# Patient Record
Sex: Female | Born: 1980
Health system: Southern US, Community
[De-identification: ages and names within clinical notes are randomized; demographics above are authoritative.]

## PROBLEM LIST (undated history)

## (undated) HISTORY — PX: BREAST CYST ASPIRATION: SHX578

---

## 2006-05-14 ENCOUNTER — Ambulatory Visit: Payer: Self-pay | Admitting: Obstetrics & Gynecology

## 2006-05-29 ENCOUNTER — Ambulatory Visit: Payer: Self-pay | Admitting: Obstetrics & Gynecology

## 2006-06-14 ENCOUNTER — Encounter: Payer: Self-pay | Admitting: Maternal & Fetal Medicine

## 2006-06-21 ENCOUNTER — Encounter: Payer: Self-pay | Admitting: Obstetrics and Gynecology

## 2006-06-28 ENCOUNTER — Encounter: Payer: Self-pay | Admitting: Obstetrics and Gynecology

## 2006-07-05 ENCOUNTER — Encounter: Payer: Self-pay | Admitting: Maternal & Fetal Medicine

## 2006-07-19 ENCOUNTER — Encounter: Payer: Self-pay | Admitting: Maternal & Fetal Medicine

## 2006-08-02 ENCOUNTER — Encounter: Payer: Self-pay | Admitting: Obstetrics and Gynecology

## 2006-08-02 ENCOUNTER — Observation Stay: Payer: Self-pay

## 2006-08-08 ENCOUNTER — Inpatient Hospital Stay: Payer: Self-pay | Admitting: Unknown Physician Specialty

## 2007-11-22 ENCOUNTER — Ambulatory Visit: Payer: Self-pay | Admitting: Family Medicine

## 2012-09-27 ENCOUNTER — Ambulatory Visit: Payer: Self-pay

## 2012-10-04 ENCOUNTER — Ambulatory Visit: Payer: Self-pay

## 2015-02-03 ENCOUNTER — Ambulatory Visit: Payer: Self-pay | Admitting: Podiatry

## 2015-02-10 ENCOUNTER — Ambulatory Visit (INDEPENDENT_AMBULATORY_CARE_PROVIDER_SITE_OTHER): Payer: Managed Care, Other (non HMO) | Admitting: Podiatry

## 2015-02-10 ENCOUNTER — Encounter: Payer: Self-pay | Admitting: Podiatry

## 2015-02-10 VITALS — BP 121/84 | HR 86 | Resp 16 | Ht 63.0 in | Wt 185.0 lb

## 2015-02-10 DIAGNOSIS — B078 Other viral warts: Secondary | ICD-10-CM

## 2015-02-10 DIAGNOSIS — B079 Viral wart, unspecified: Secondary | ICD-10-CM

## 2015-02-10 DIAGNOSIS — B070 Plantar wart: Secondary | ICD-10-CM

## 2015-02-10 MED ORDER — CIMETIDINE 800 MG PO TABS: 800.0000 mg | ORAL_TABLET | Freq: Every day | ORAL | Status: AC

## 2015-02-10 NOTE — Progress Notes (Signed)
   Subjective:    Patient ID: Michelle Garrett, female    DOB: 1980-10-09, 34 y.o.   MRN: [id number]  HPI  Patient presents with bilateral warts; ball of feet, heel & great toes. Pt stated, "warts are not painful"; x3 months.  Pt is diabetic type 2, Sugar=did not take today; A1C=5.6.  Review of Systems  All other systems reviewed and are negative.      Objective:   Physical Exam        Assessment & Plan:

## 2015-02-11 NOTE — Progress Notes (Signed)
Subjective:     Patient ID: Michelle Garrett, female   DOB: 1980/09/01, 34 y.o.   MRN: [id number]  HPI patient presents with numerous lesions on the bottom of both feet that are moderately painful but she is concerned about the extent of them and there is black spots within a number of the lesions themselves. States it's been present for several months   Review of Systems  All other systems reviewed and are negative.      Objective:   Physical Exam  Constitutional: She is oriented to person, place, and time.  Cardiovascular: Intact distal pulses.   Musculoskeletal: Normal range of motion.  Neurological: She is oriented to person, place, and time.  Skin: Skin is warm.  Nursing note and vitals reviewed.  neurovascular status found to be intact with muscle strength adequate range of motion within normal limits with patient noted to have multiple small lesions plantar aspect both feet that upon debridement show pinpoint bleeding and are painful to lateral pressure. There is a approximate 30 lesions toe with about 15 on each foot     Assessment:     Verruca plantaris plantar aspect bilateral    Plan:     Individual debridement of all lesions accomplished and applied chemical agent to create an immune response and sterile dressings. Instructed what to do if blistering were to occur

## 2015-03-10 ENCOUNTER — Encounter: Payer: Self-pay | Admitting: Podiatry

## 2015-03-10 ENCOUNTER — Ambulatory Visit (INDEPENDENT_AMBULATORY_CARE_PROVIDER_SITE_OTHER): Payer: Managed Care, Other (non HMO) | Admitting: Podiatry

## 2015-03-10 VITALS — BP 117/76 | HR 86 | Resp 16

## 2015-03-10 DIAGNOSIS — B070 Plantar wart: Secondary | ICD-10-CM

## 2015-03-10 DIAGNOSIS — B079 Viral wart, unspecified: Secondary | ICD-10-CM

## 2015-03-10 DIAGNOSIS — B078 Other viral warts: Secondary | ICD-10-CM

## 2015-03-15 NOTE — Progress Notes (Signed)
Subjective:     Patient ID: Michelle Garrett, female   DOB: 03/17/1980, 35 y.o.   MRN: [id number]  HPI patient states the warts are still present but some improvement from previous   Review of Systems     Objective:   Physical Exam Neurovascular status intact with keratotic lesion still noted that upon debridement show pinpoint bleeding bilateral    Assessment:     Verruca plantaris plantar aspect of both feet    Plan:     Debride lesions and at this time applied a small amount of chemical to the areas and sterile dressings. Reappoint to recheck

## 2015-07-20 ENCOUNTER — Other Ambulatory Visit: Payer: Self-pay | Admitting: Obstetrics and Gynecology

## 2015-07-20 DIAGNOSIS — Z1231 Encounter for screening mammogram for malignant neoplasm of breast: Secondary | ICD-10-CM

## 2015-08-09 ENCOUNTER — Ambulatory Visit
Admission: RE | Admit: 2015-08-09 | Discharge: 2015-08-09 | Disposition: A | Discharge status: Home / Self Care | Payer: 59 | Source: Ambulatory Visit | Attending: Obstetrics and Gynecology | Admitting: Obstetrics and Gynecology

## 2015-08-09 DIAGNOSIS — Z1231 Encounter for screening mammogram for malignant neoplasm of breast: Secondary | ICD-10-CM | POA: Insufficient documentation

## 2016-03-08 DIAGNOSIS — E119 Type 2 diabetes mellitus without complications: Secondary | ICD-10-CM | POA: Diagnosis not present

## 2016-03-08 DIAGNOSIS — D696 Thrombocytopenia, unspecified: Secondary | ICD-10-CM | POA: Diagnosis not present

## 2016-04-14 DIAGNOSIS — J9801 Acute bronchospasm: Secondary | ICD-10-CM | POA: Diagnosis not present

## 2016-07-25 DIAGNOSIS — Z01419 Encounter for gynecological examination (general) (routine) without abnormal findings: Secondary | ICD-10-CM | POA: Diagnosis not present

## 2016-08-16 DIAGNOSIS — R7302 Impaired glucose tolerance (oral): Secondary | ICD-10-CM | POA: Diagnosis not present

## 2016-08-16 DIAGNOSIS — I10 Essential (primary) hypertension: Secondary | ICD-10-CM | POA: Diagnosis not present

## 2016-08-21 DIAGNOSIS — E119 Type 2 diabetes mellitus without complications: Secondary | ICD-10-CM | POA: Diagnosis not present

## 2016-12-27 DIAGNOSIS — Z23 Encounter for immunization: Secondary | ICD-10-CM | POA: Diagnosis not present

## 2017-08-22 DIAGNOSIS — Z01419 Encounter for gynecological examination (general) (routine) without abnormal findings: Secondary | ICD-10-CM | POA: Diagnosis not present

## 2017-08-22 DIAGNOSIS — N6320 Unspecified lump in the left breast, unspecified quadrant: Secondary | ICD-10-CM | POA: Diagnosis not present

## 2017-08-22 DIAGNOSIS — N6310 Unspecified lump in the right breast, unspecified quadrant: Secondary | ICD-10-CM | POA: Diagnosis not present

## 2017-08-23 ENCOUNTER — Other Ambulatory Visit: Payer: Self-pay | Admitting: Obstetrics and Gynecology

## 2017-08-23 DIAGNOSIS — N6310 Unspecified lump in the right breast, unspecified quadrant: Secondary | ICD-10-CM

## 2017-08-23 DIAGNOSIS — N6320 Unspecified lump in the left breast, unspecified quadrant: Principal | ICD-10-CM

## 2017-08-24 ENCOUNTER — Ambulatory Visit
Admission: RE | Admit: 2017-08-24 | Discharge: 2017-08-24 | Disposition: A | Discharge status: Home / Self Care | Payer: 59 | Source: Ambulatory Visit | Attending: Obstetrics and Gynecology | Admitting: Obstetrics and Gynecology

## 2017-08-24 DIAGNOSIS — N6310 Unspecified lump in the right breast, unspecified quadrant: Secondary | ICD-10-CM | POA: Insufficient documentation

## 2017-08-24 DIAGNOSIS — N6012 Diffuse cystic mastopathy of left breast: Secondary | ICD-10-CM | POA: Diagnosis not present

## 2017-08-24 DIAGNOSIS — N6320 Unspecified lump in the left breast, unspecified quadrant: Principal | ICD-10-CM

## 2017-08-24 DIAGNOSIS — N6001 Solitary cyst of right breast: Secondary | ICD-10-CM | POA: Diagnosis not present

## 2017-08-24 DIAGNOSIS — R922 Inconclusive mammogram: Secondary | ICD-10-CM | POA: Diagnosis not present

## 2017-08-29 ENCOUNTER — Other Ambulatory Visit: Payer: 59

## 2017-12-24 DIAGNOSIS — L819 Disorder of pigmentation, unspecified: Secondary | ICD-10-CM | POA: Diagnosis not present

## 2017-12-24 DIAGNOSIS — L02219 Cutaneous abscess of trunk, unspecified: Secondary | ICD-10-CM | POA: Diagnosis not present

## 2018-04-18 DIAGNOSIS — Z23 Encounter for immunization: Secondary | ICD-10-CM | POA: Diagnosis not present

## 2018-04-18 DIAGNOSIS — Z0000 Encounter for general adult medical examination without abnormal findings: Secondary | ICD-10-CM | POA: Diagnosis not present

## 2018-04-18 DIAGNOSIS — R030 Elevated blood-pressure reading, without diagnosis of hypertension: Secondary | ICD-10-CM | POA: Diagnosis not present

## 2018-04-18 DIAGNOSIS — Z131 Encounter for screening for diabetes mellitus: Secondary | ICD-10-CM | POA: Diagnosis not present

## 2018-04-18 DIAGNOSIS — Z130 Encounter for screening for diseases of the blood and blood-forming organs and certain disorders involving the immune mechanism: Secondary | ICD-10-CM | POA: Diagnosis not present

## 2020-02-26 ENCOUNTER — Other Ambulatory Visit: Payer: Self-pay | Admitting: Student

## 2020-02-26 DIAGNOSIS — R748 Abnormal levels of other serum enzymes: Secondary | ICD-10-CM

## 2020-03-24 ENCOUNTER — Other Ambulatory Visit: Payer: Self-pay

## 2020-03-24 ENCOUNTER — Ambulatory Visit
Admission: RE | Admit: 2020-03-24 | Discharge: 2020-03-24 | Disposition: A | Discharge status: Home / Self Care | Payer: 59 | Source: Ambulatory Visit | Attending: Student | Admitting: Student

## 2020-03-24 DIAGNOSIS — R748 Abnormal levels of other serum enzymes: Secondary | ICD-10-CM | POA: Diagnosis not present

## 2020-05-04 ENCOUNTER — Other Ambulatory Visit: Payer: Self-pay | Admitting: Student

## 2020-05-04 DIAGNOSIS — Z803 Family history of malignant neoplasm of breast: Secondary | ICD-10-CM

## 2020-05-04 DIAGNOSIS — N6452 Nipple discharge: Secondary | ICD-10-CM

## 2020-05-05 ENCOUNTER — Ambulatory Visit
Admission: RE | Admit: 2020-05-05 | Discharge: 2020-05-05 | Disposition: A | Discharge status: Home / Self Care | Payer: 59 | Source: Ambulatory Visit | Attending: Student | Admitting: Student

## 2020-05-05 ENCOUNTER — Other Ambulatory Visit: Payer: Self-pay

## 2020-05-05 DIAGNOSIS — Z803 Family history of malignant neoplasm of breast: Secondary | ICD-10-CM | POA: Insufficient documentation

## 2020-05-05 DIAGNOSIS — N6452 Nipple discharge: Secondary | ICD-10-CM | POA: Diagnosis not present

## 2020-05-11 ENCOUNTER — Other Ambulatory Visit: Payer: Self-pay | Admitting: Student

## 2020-05-11 DIAGNOSIS — N6001 Solitary cyst of right breast: Secondary | ICD-10-CM

## 2020-05-11 DIAGNOSIS — N6452 Nipple discharge: Secondary | ICD-10-CM

## 2020-05-11 DIAGNOSIS — R928 Other abnormal and inconclusive findings on diagnostic imaging of breast: Secondary | ICD-10-CM

## 2020-05-14 ENCOUNTER — Ambulatory Visit
Admission: RE | Admit: 2020-05-14 | Discharge: 2020-05-14 | Disposition: A | Discharge status: Home / Self Care | Payer: 59 | Source: Ambulatory Visit | Attending: Student | Admitting: Student

## 2020-05-14 ENCOUNTER — Other Ambulatory Visit: Payer: Self-pay

## 2020-05-14 DIAGNOSIS — R928 Other abnormal and inconclusive findings on diagnostic imaging of breast: Secondary | ICD-10-CM | POA: Diagnosis present

## 2020-05-14 DIAGNOSIS — N6001 Solitary cyst of right breast: Secondary | ICD-10-CM | POA: Diagnosis present

## 2020-05-25 ENCOUNTER — Ambulatory Visit
Admission: RE | Admit: 2020-05-25 | Discharge: 2020-05-25 | Disposition: A | Discharge status: Home / Self Care | Payer: 59 | Source: Ambulatory Visit | Attending: Student | Admitting: Student

## 2020-05-25 ENCOUNTER — Other Ambulatory Visit: Payer: Self-pay

## 2020-05-25 DIAGNOSIS — N6452 Nipple discharge: Secondary | ICD-10-CM | POA: Insufficient documentation

## 2020-05-25 MED ORDER — GADOBUTROL 1 MMOL/ML IV SOLN
8.0000 mL | Freq: Once | INTRAVENOUS | Status: AC | PRN
  Administered 2020-05-25: 8 mL via INTRAVENOUS

## 2020-06-14 ENCOUNTER — Other Ambulatory Visit: Payer: Self-pay

## 2020-06-14 ENCOUNTER — Ambulatory Visit (INDEPENDENT_AMBULATORY_CARE_PROVIDER_SITE_OTHER): Payer: 59 | Admitting: Dermatology

## 2020-06-14 DIAGNOSIS — L308 Other specified dermatitis: Secondary | ICD-10-CM

## 2020-06-14 DIAGNOSIS — L309 Dermatitis, unspecified: Secondary | ICD-10-CM

## 2020-06-14 DIAGNOSIS — L578 Other skin changes due to chronic exposure to nonionizing radiation: Secondary | ICD-10-CM | POA: Diagnosis not present

## 2020-06-14 DIAGNOSIS — D229 Melanocytic nevi, unspecified: Secondary | ICD-10-CM

## 2020-06-14 MED ORDER — KETOCONAZOLE 2 % EX CREA: 1.0000 "application " | TOPICAL_CREAM | Freq: Two times a day (BID) | CUTANEOUS | 0 refills | Status: AC

## 2020-06-14 MED ORDER — TRIAMCINOLONE ACETONIDE 0.1 % EX OINT: 1.0000 "application " | TOPICAL_OINTMENT | Freq: Two times a day (BID) | CUTANEOUS | 2 refills | Status: AC | PRN

## 2020-06-14 MED ORDER — MUPIROCIN 2 % EX OINT: 1.0000 "application " | TOPICAL_OINTMENT | Freq: Two times a day (BID) | CUTANEOUS | 0 refills | Status: AC

## 2020-06-14 NOTE — Progress Notes (Signed)
   New Patient Visit  Subjective  Michelle Garrett is a 40 y.o. female who presents for the following: Skin Problem (New patient here today for swelling and itching at the left nipple, present for about 4 months. She previously had drainage that was yellowish green and sticky but that has resolved. Patient has had MRI and ultrasound but neither showed any sign of [place]. ).  The following portions of the chart were reviewed this encounter and updated as appropriate:   Allergies  Meds  Problems  Med Hx  Surg Hx  Fam Hx      Review of Systems:  No other skin or systemic complaints except as noted in HPI or Assessment and Plan.  Objective  Well appearing patient in no apparent distress; mood and affect are within normal limits.  A focused examination was performed including chest. Relevant physical exam findings are noted in the Assessment and Plan.  Objective  Left nipple: Scaly pink plaque at nipple and extending 76mm peripherally    Assessment & Plan  Nipple dermatitis Left nipple  Start ketoconazole 2% cream BID to AA. Start mupirocin ointment BID to AA. Start TMC 0.1% ointment BID to AA for up to 2 weeks. Avoid applying to face, groin, and axilla. Use as directed. Risk of skin atrophy with long-term use reviewed.   Topical steroids (such as triamcinolone, fluocinolone, fluocinonide, mometasone, clobetasol, halobetasol, betamethasone, hydrocortisone) can cause thinning and lightening of the skin if they are used for too long in the same area. Your physician has selected the right strength medicine for your problem and area affected on the body. Please use your medication only as directed by your physician to prevent side effects.    Ordered Medications: ketoconazole (NIZORAL) 2 % cream triamcinolone ointment (KENALOG) 0.1 % mupirocin ointment (BACTROBAN) 2 %  Melanocytic Nevi - Tan-brown and/or pink-flesh-colored symmetric macules and papules - Benign appearing on exam  today - Observation - Call clinic for new or changing moles - Recommend daily use of broad spectrum spf 30+ sunscreen to sun-exposed areas.   Actinic Damage - chronic, secondary to cumulative UV radiation exposure/sun exposure over time - diffuse scaly erythematous macules with underlying dyspigmentation - Recommend daily broad spectrum sunscreen SPF 30+ to sun-exposed areas, reapply every 2 hours as needed.  - Recommend staying in the shade or wearing long sleeves, sun glasses (UVA+UVB protection) and wide brim hats (4-inch brim around the entire circumference of the hat). - Call for new or changing lesions.  Return in about 2 weeks (around 06/28/2020).  Graciella Belton, RMA, am acting as scribe for Forest Gleason, MD .  Documentation: I have reviewed the above documentation for accuracy and completeness, and I agree with the above.  Forest Gleason, MD

## 2020-06-14 NOTE — Patient Instructions (Addendum)
Start ketoconazole 2% cream twice daily. Start mupirocin ointment twice daily. Start triamcinolone 0.1% ointment twice daily for up to 2 weeks. Avoid applying to face, groin, and axilla. Use as directed. Risk of skin atrophy with long-term use reviewed.   Recommend daily broad spectrum sunscreen SPF 30+ to sun-exposed areas, reapply every 2 hours as needed. Call for new or changing lesions.  Staying in the shade or wearing long sleeves, sun glasses (UVA+UVB protection) and wide brim hats (4-inch brim around the entire circumference of the hat) are also recommended for sun protection.   Recommend taking Heliocare sun protection supplement daily in sunny weather for additional sun protection. For maximum protection on the sunniest days, you can take up to 2 capsules of regular Heliocare OR take 1 capsule of Heliocare Ultra. For prolonged exposure (such as a full day in the sun), you can repeat your dose of the supplement 4 hours after your first dose. Heliocare can be purchased at [facility] or at [email or web address].   If you have any questions or concerns for your doctor, please call our main line at [phone] and press option 4 to reach your doctor's medical assistant. If no one answers, please leave a voicemail as directed and we will return your call as soon as possible. Messages left after 4 pm will be answered the following business day.   You may also send [place] a message via [place]. We typically respond to MyChart messages within 1-2 business days.  For prescription refills, please ask your pharmacy to contact our office. Our fax number is [phone].  If you have an urgent issue when the clinic is closed that cannot wait until the next business day, you can page your doctor at the number below.    Please note that while we do our best to be available for urgent issues outside of office hours, we are not available 24/7.   If you have an urgent issue and are unable to reach [place],  you may choose to seek medical care at your doctor's office, retail clinic, urgent care center, or emergency room.  If you have a medical emergency, please immediately call 911 or go to the emergency department.  Pager Numbers  - Dr. Nehemiah Massed: [phone]  - Dr. Laurence Ferrari: [phone]  - Dr. Nicole Kindred: [phone]  In the event of inclement weather, please call our main line at [phone] for an update on the status of any delays or closures.  Dermatology Medication Tips: Please keep the boxes that topical medications come in in order to help keep track of the instructions about where and how to use these. Pharmacies typically print the medication instructions only on the boxes and not directly on the medication tubes.   If your medication is too expensive, please contact our office at [phone] option 4 or send [place] a message through [place].   We are unable to tell what your co-pay for medications will be in advance as this is different depending on your insurance coverage. However, we may be able to find a substitute medication at lower cost or fill out paperwork to get insurance to cover a needed medication.   If a prior authorization is required to get your medication covered by your insurance company, please allow [place] 1-2 business days to complete this process.  Drug prices often vary depending on where the prescription is filled and some pharmacies may offer cheaper prices.  The website www.goodrx.com contains coupons for medications through different pharmacies. The prices here do  not account for what the cost may be with help from insurance (it may be cheaper with your insurance), but the website can give you the price if you did not use any insurance.  - You can print the associated coupon and take it with your prescription to the pharmacy.  - You may also stop by our office during regular business hours and pick up a GoodRx coupon card.  - If you need your prescription sent  electronically to a different pharmacy, notify our office through [facility] MyChart or by phone at [phone] option 4.  

## 2020-06-21 ENCOUNTER — Encounter: Payer: Self-pay | Admitting: Dermatology

## 2020-06-30 ENCOUNTER — Ambulatory Visit: Payer: 59 | Admitting: Dermatology

## 2020-10-27 ENCOUNTER — Other Ambulatory Visit: Payer: Self-pay

## 2020-10-27 DIAGNOSIS — Z1231 Encounter for screening mammogram for malignant neoplasm of breast: Secondary | ICD-10-CM

## 2021-05-06 ENCOUNTER — Ambulatory Visit
Admission: RE | Admit: 2021-05-06 | Discharge: 2021-05-06 | Disposition: A | Discharge status: Home / Self Care | Payer: 59 | Source: Ambulatory Visit

## 2021-05-06 ENCOUNTER — Other Ambulatory Visit: Payer: Self-pay

## 2021-05-06 DIAGNOSIS — Z1231 Encounter for screening mammogram for malignant neoplasm of breast: Secondary | ICD-10-CM | POA: Insufficient documentation

## 2021-05-13 ENCOUNTER — Other Ambulatory Visit: Payer: Self-pay

## 2021-05-13 DIAGNOSIS — R928 Other abnormal and inconclusive findings on diagnostic imaging of breast: Secondary | ICD-10-CM

## 2021-05-13 DIAGNOSIS — N6489 Other specified disorders of breast: Secondary | ICD-10-CM

## 2021-05-31 ENCOUNTER — Ambulatory Visit
Admission: RE | Admit: 2021-05-31 | Discharge: 2021-05-31 | Disposition: A | Discharge status: Home / Self Care | Payer: 59 | Source: Ambulatory Visit

## 2021-05-31 ENCOUNTER — Other Ambulatory Visit: Payer: Self-pay

## 2021-05-31 DIAGNOSIS — N6489 Other specified disorders of breast: Secondary | ICD-10-CM | POA: Diagnosis present

## 2021-05-31 DIAGNOSIS — R928 Other abnormal and inconclusive findings on diagnostic imaging of breast: Secondary | ICD-10-CM | POA: Diagnosis not present

## 2021-11-10 ENCOUNTER — Other Ambulatory Visit: Payer: Self-pay

## 2021-11-10 DIAGNOSIS — Z803 Family history of malignant neoplasm of breast: Secondary | ICD-10-CM

## 2022-08-23 ENCOUNTER — Other Ambulatory Visit: Payer: Self-pay

## 2022-08-23 DIAGNOSIS — Z803 Family history of malignant neoplasm of breast: Secondary | ICD-10-CM

## 2022-08-28 ENCOUNTER — Other Ambulatory Visit: Payer: Self-pay

## 2022-08-28 DIAGNOSIS — Z1231 Encounter for screening mammogram for malignant neoplasm of breast: Secondary | ICD-10-CM

## 2022-08-29 ENCOUNTER — Ambulatory Visit
Admission: RE | Admit: 2022-08-29 | Discharge: 2022-08-29 | Disposition: A | Discharge status: Home / Self Care | Payer: 59 | Source: Ambulatory Visit

## 2022-08-29 DIAGNOSIS — Z1239 Encounter for other screening for malignant neoplasm of breast: Secondary | ICD-10-CM | POA: Insufficient documentation

## 2022-08-29 DIAGNOSIS — Z803 Family history of malignant neoplasm of breast: Secondary | ICD-10-CM | POA: Diagnosis present

## 2022-08-29 MED ORDER — GADOBUTROL 1 MMOL/ML IV SOLN
7.5000 mL | Freq: Once | INTRAVENOUS | Status: AC | PRN
  Administered 2022-08-29: 7.5 mL via INTRAVENOUS

## 2022-09-13 IMAGING — US US BREAST*L* LIMITED INC AXILLA
1 series · 5 of 5 positions shown · non-contrast
Comparison: Previous exam(s).

CLINICAL DATA: Callback for LEFT breast asymmetry seen on CC view.
Previous LEFT breast MRI in 2122 with benign results. LEFT nipple
irritation and discharge in 2122; has been seen by dermatology in
symptoms have resolved. Patient has a strong family history of
breast cancer

EXAM:
DIGITAL DIAGNOSTIC UNILATERAL LEFT MAMMOGRAM WITH TOMOSYNTHESIS AND
CAD; ULTRASOUND LEFT BREAST LIMITED
TECHNIQUE: Left digital diagnostic mammography and breast tomosynthesis was
performed. The images were evaluated with computer-aided detection.;
Targeted ultrasound examination of the left breast was performed.

[Series 1: us breast*left* limited inc axilla · 0.06mm/px · 5 of 5 slices shown]
[im 1/5]
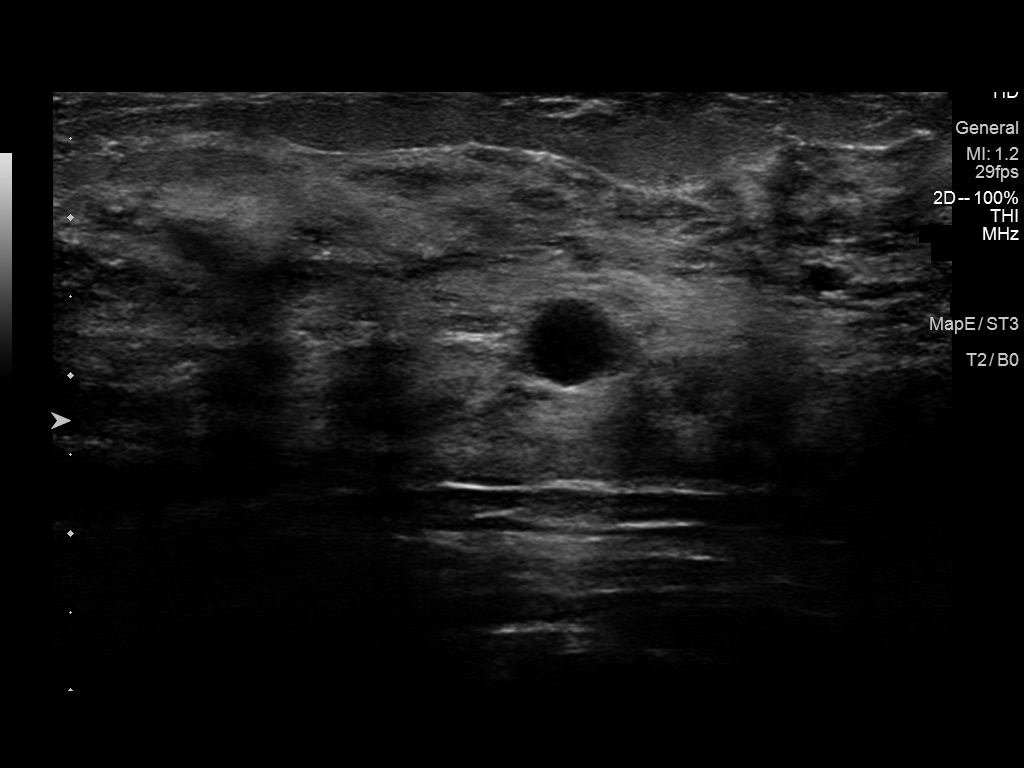
[im 2/5]
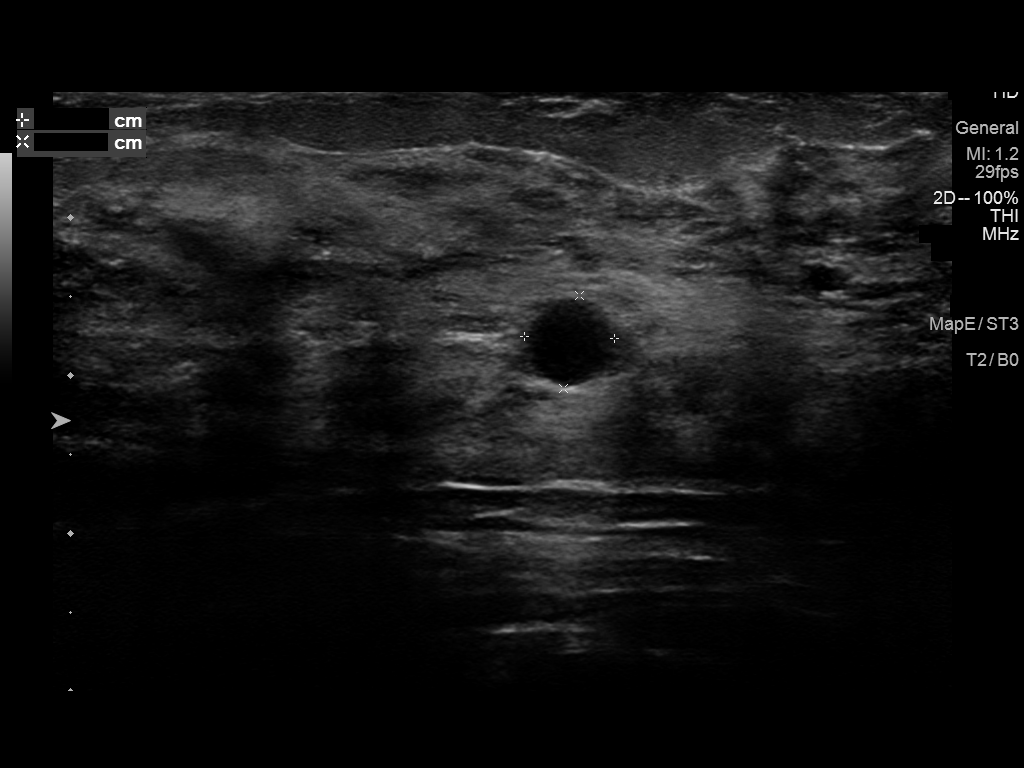
[im 3/5]
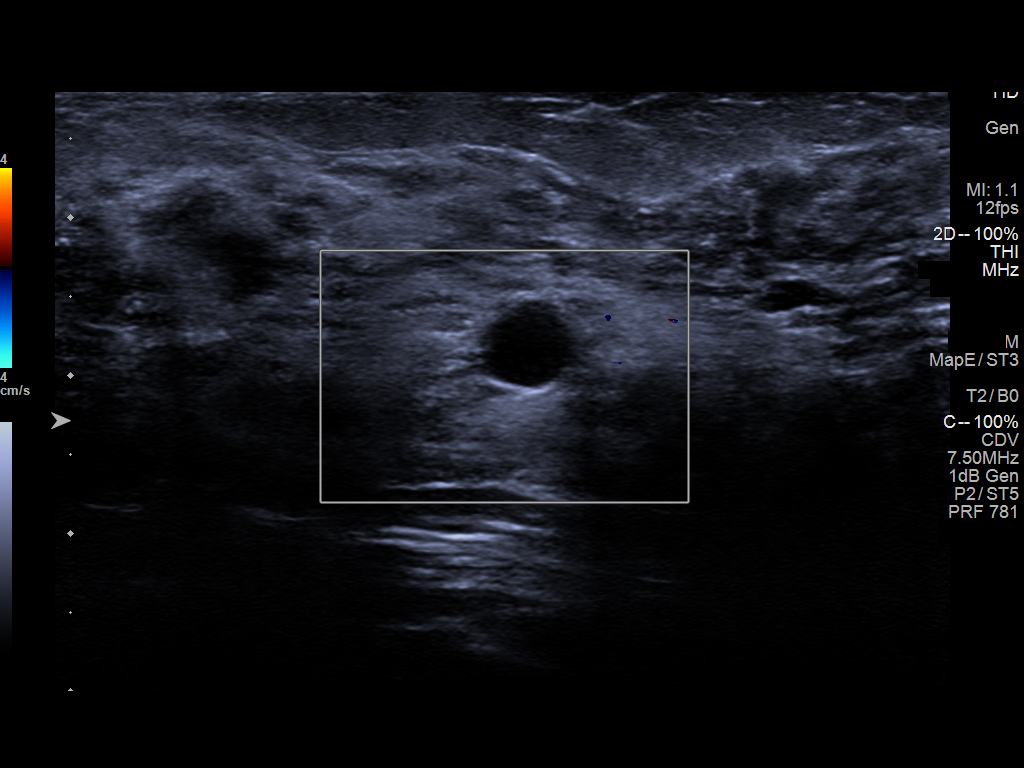
[im 4/5]
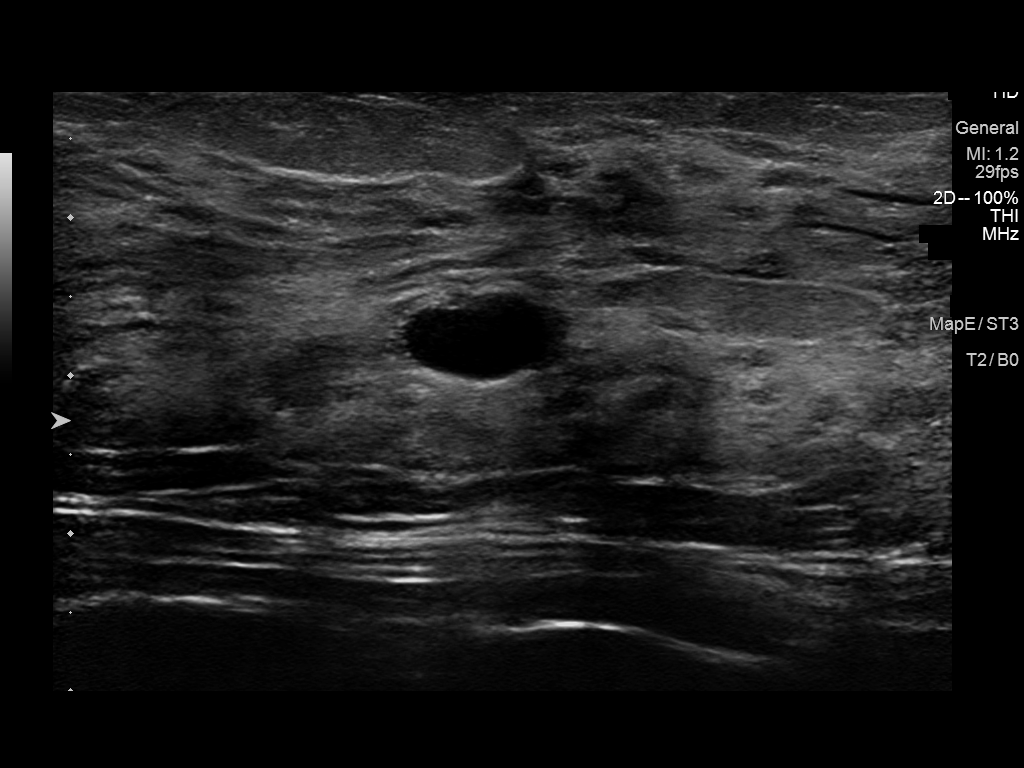
[im 5/5]
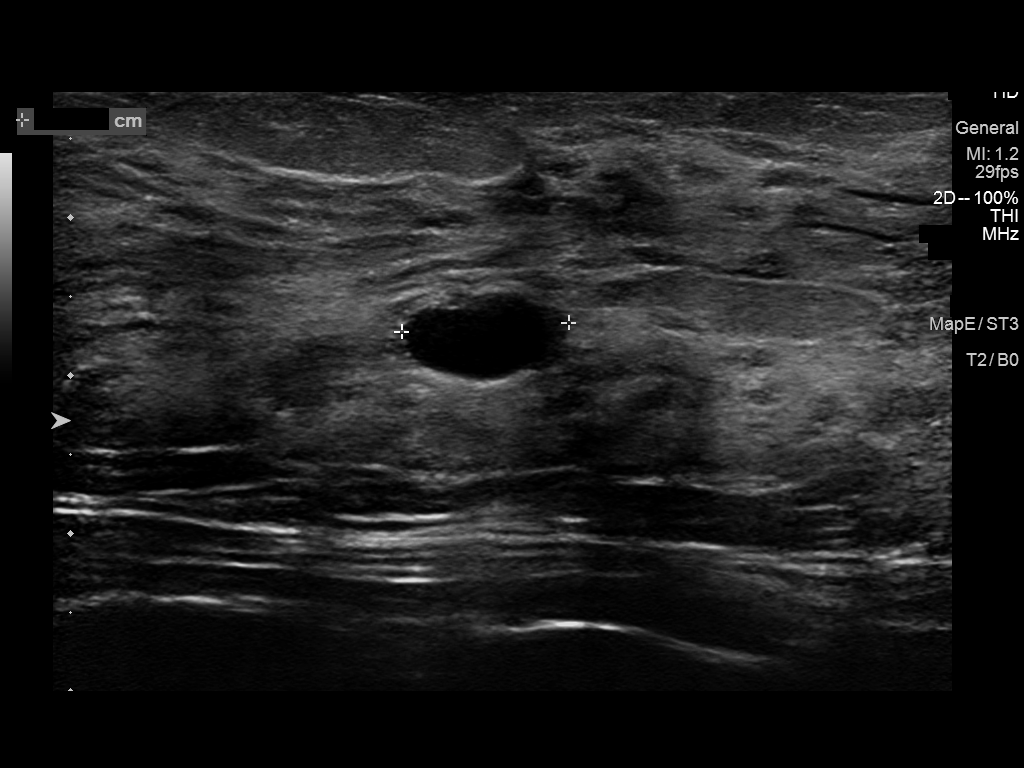

[5 of 5 positions shown; findings below may reference images not displayed]

ACR Breast Density Category d: The breast tissue is extremely dense,
which lowers the sensitivity of mammography.
FINDINGS: The previously described finding does not persist with additional
views, consistent with superimposed fibroglandular tissue. No
suspicious mass, microcalcification, or other finding is identified.

On physical exam, no suspicious mass is appreciated.

Targeted ultrasound was performed of the LEFT central breast. No
suspicious cystic or solid mass is seen at the site of screening
mammographic concern. Multiple benign cysts are noted central
breast. At 12 o'clock less than 1 cm from the nipple, there is an
oval circumscribed anechoic mass with posterior acoustic
enhancement. It measures 11 x 6 x 6 mm and is consistent with a
benign simple cyst.
IMPRESSION: No mammographic or sonographic evidence of malignancy. Multiple
benign cysts are noted.

RECOMMENDATION:
1. Recommend evaluation for candidacy for annual high risk screening
protocol with annual screening mammogram and annual screening MRI.
Given extreme breast density and family history of breast cancer,
supplemental screening in addition to annual screening mammography
should be strongly considered. The American Cancer Society
recommends annual MRI and mammography in patients with an estimated
lifetime risk of developing breast cancer greater than 20 - 25%, or
who are known or suspected to be positive for the breast cancer
gene.

I have discussed the findings and recommendations with the patient.
If applicable, a reminder letter will be sent to the patient
regarding the next appointment.

BI-RADS CATEGORY  2: Benign.

## 2022-09-13 IMAGING — MG MM DIGITAL DIAGNOSTIC UNILAT*L* W/ TOMO W/ CAD
6 series · 6 of 18 positions shown · non-contrast
Comparison: Previous exam(s).

CLINICAL DATA: Callback for LEFT breast asymmetry seen on CC view.
Previous LEFT breast MRI in 2122 with benign results. LEFT nipple
irritation and discharge in 2122; has been seen by dermatology in
symptoms have resolved. Patient has a strong family history of
breast cancer

EXAM:
DIGITAL DIAGNOSTIC UNILATERAL LEFT MAMMOGRAM WITH TOMOSYNTHESIS AND
CAD; ULTRASOUND LEFT BREAST LIMITED
TECHNIQUE: Left digital diagnostic mammography and breast tomosynthesis was
performed. The images were evaluated with computer-aided detection.;
Targeted ultrasound examination of the left breast was performed.

[L CC synth-2D]
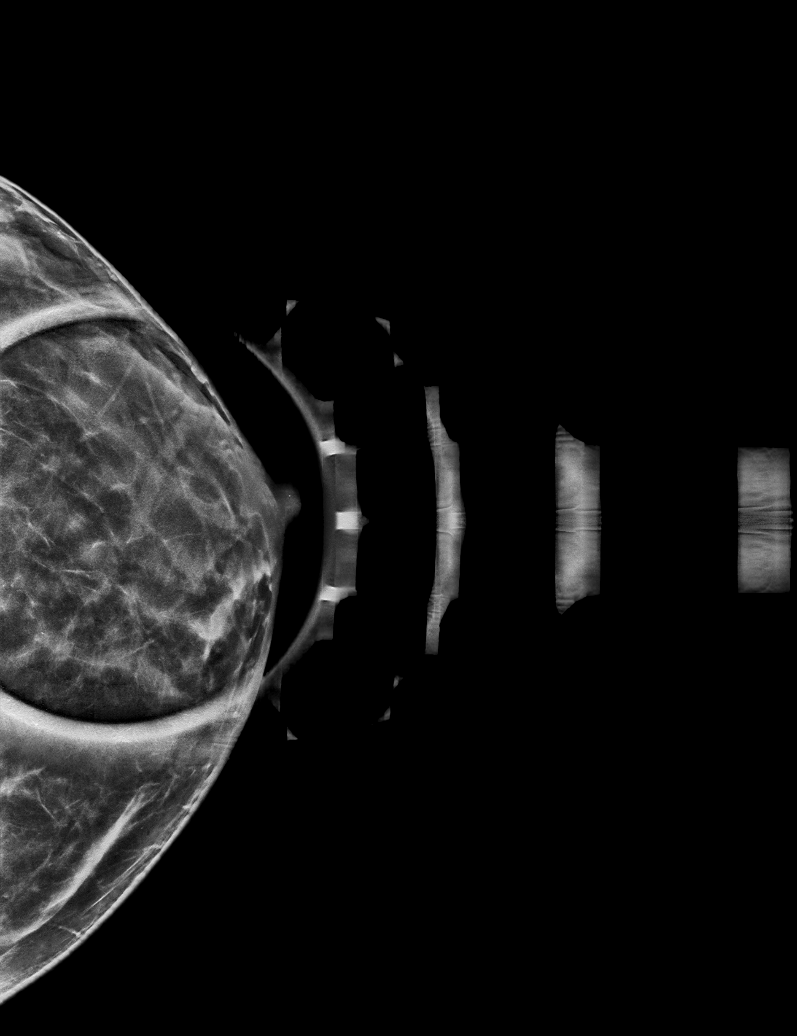

[L ML synth-2D]
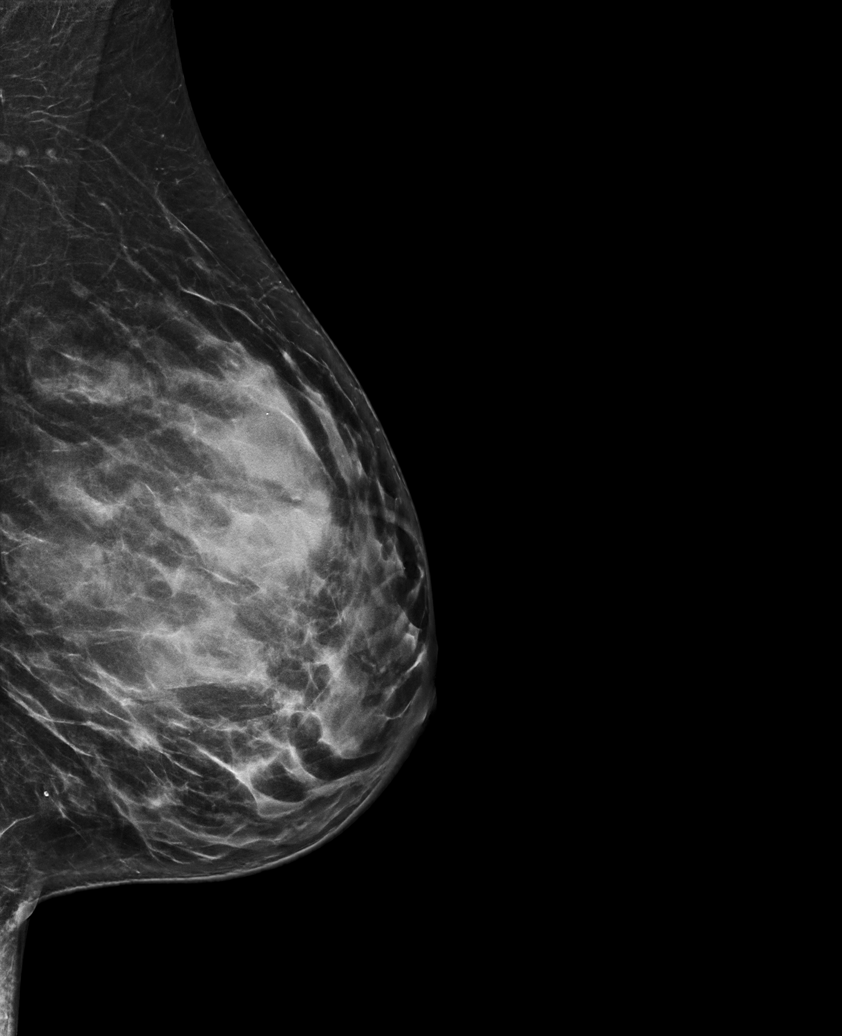

[L MLO synth-2D]
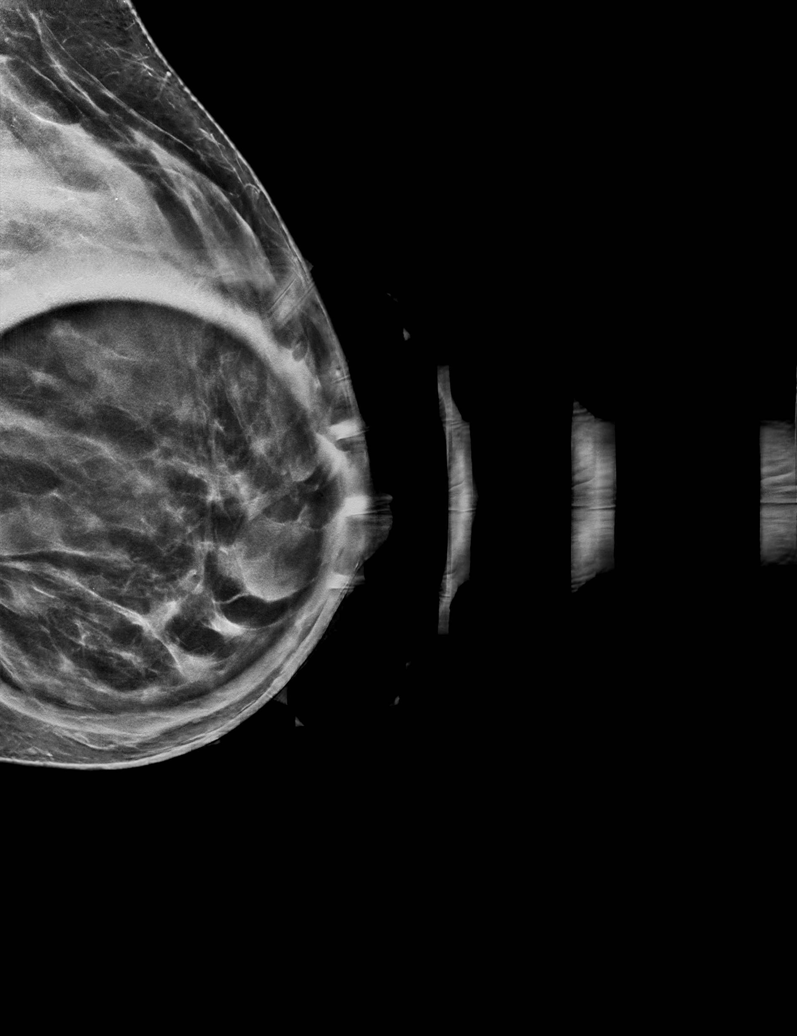

[L MLO tomo · tomo slice 37/73.0]
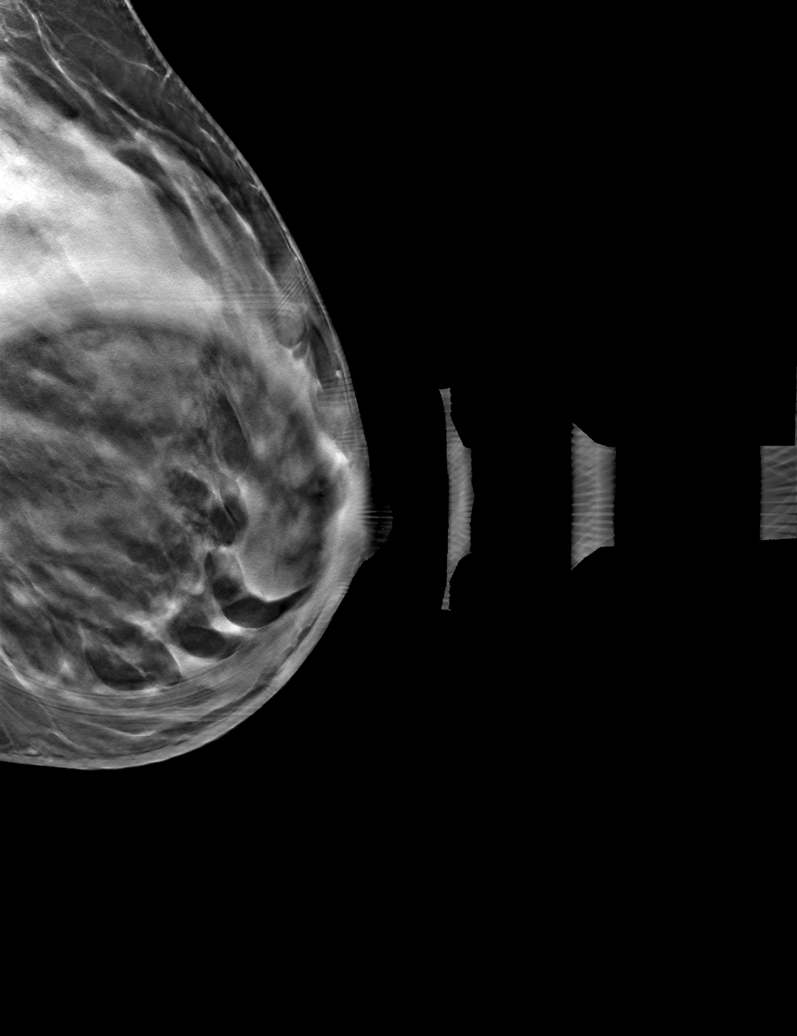

[L CC tomo · tomo slice 29/58.0]
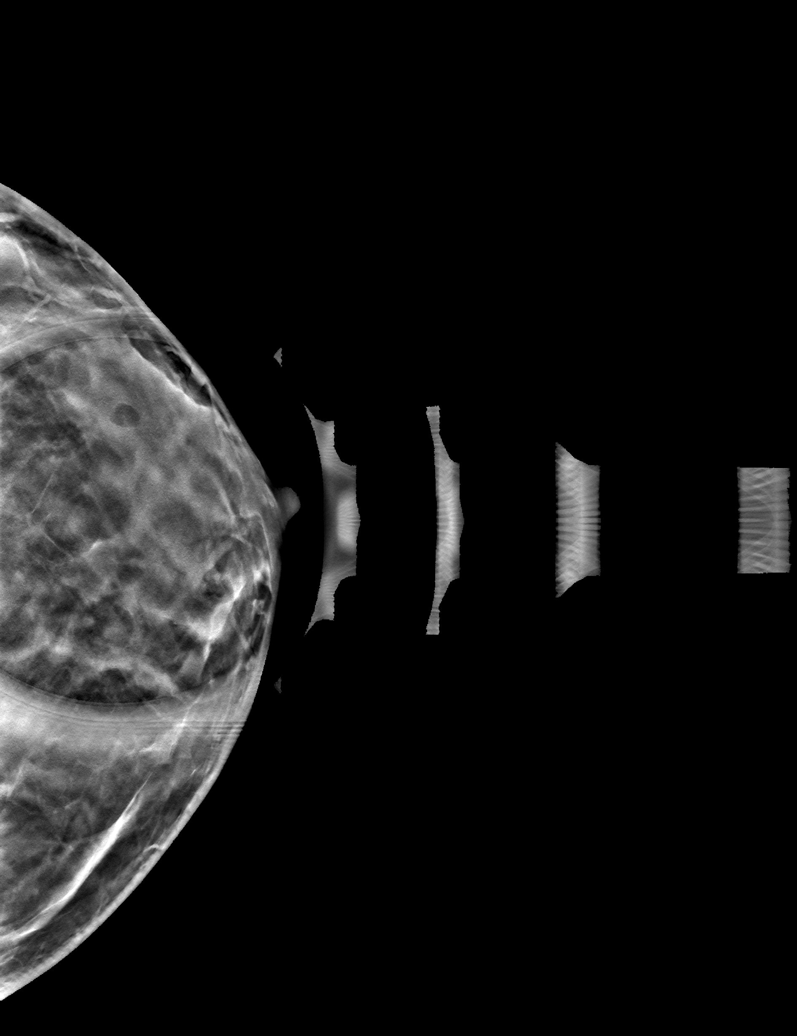

[L ML tomo · tomo slice 35/69.0]
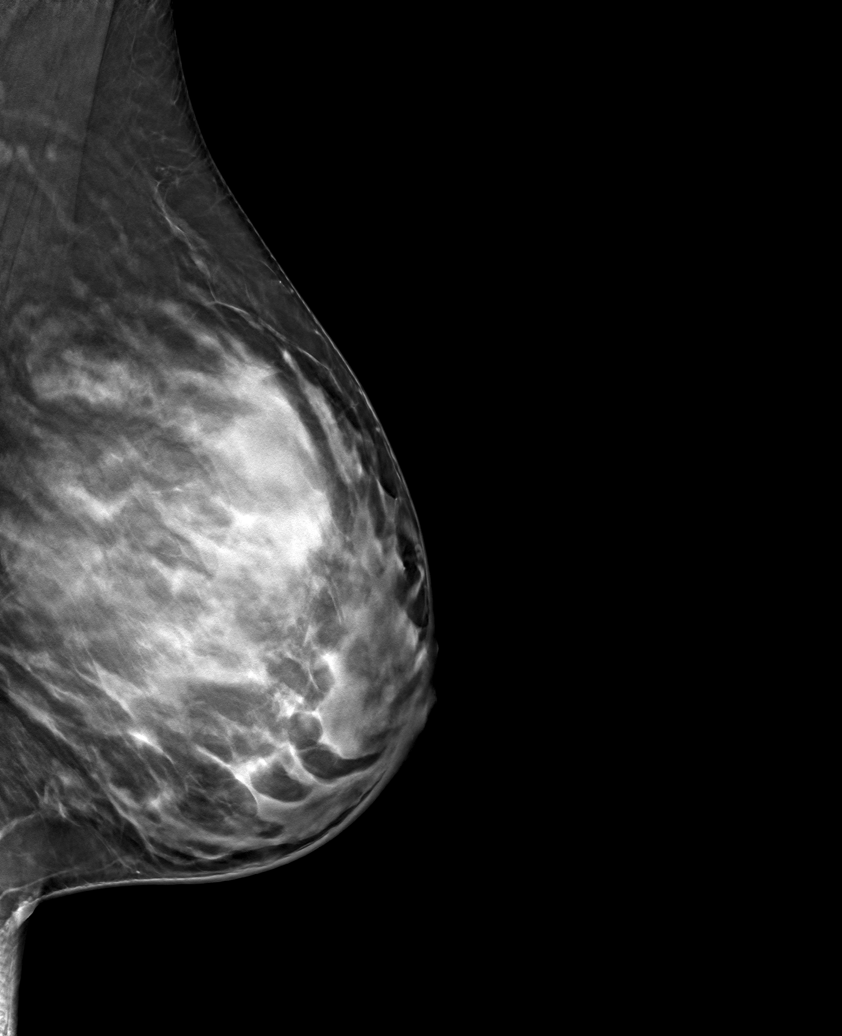

[6 of 18 positions shown; findings below may reference images not displayed]

ACR Breast Density Category d: The breast tissue is extremely dense,
which lowers the sensitivity of mammography.
FINDINGS: The previously described finding does not persist with additional
views, consistent with superimposed fibroglandular tissue. No
suspicious mass, microcalcification, or other finding is identified.

On physical exam, no suspicious mass is appreciated.

Targeted ultrasound was performed of the LEFT central breast. No
suspicious cystic or solid mass is seen at the site of screening
mammographic concern. Multiple benign cysts are noted central
breast. At 12 o'clock less than 1 cm from the nipple, there is an
oval circumscribed anechoic mass with posterior acoustic
enhancement. It measures 11 x 6 x 6 mm and is consistent with a
benign simple cyst.
IMPRESSION: No mammographic or sonographic evidence of malignancy. Multiple
benign cysts are noted.

RECOMMENDATION:
1. Recommend evaluation for candidacy for annual high risk screening
protocol with annual screening mammogram and annual screening MRI.
Given extreme breast density and family history of breast cancer,
supplemental screening in addition to annual screening mammography
should be strongly considered. The American Cancer Society
recommends annual MRI and mammography in patients with an estimated
lifetime risk of developing breast cancer greater than 20 - 25%, or
who are known or suspected to be positive for the breast cancer
gene.

I have discussed the findings and recommendations with the patient.
If applicable, a reminder letter will be sent to the patient
regarding the next appointment.

BI-RADS CATEGORY  2: Benign.

## 2022-09-21 ENCOUNTER — Ambulatory Visit
Admission: RE | Admit: 2022-09-21 | Discharge: 2022-09-21 | Disposition: A | Discharge status: Home / Self Care | Payer: 59 | Source: Ambulatory Visit

## 2022-09-21 DIAGNOSIS — Z1231 Encounter for screening mammogram for malignant neoplasm of breast: Secondary | ICD-10-CM | POA: Diagnosis present

## 2023-08-09 ENCOUNTER — Other Ambulatory Visit: Payer: Self-pay | Admitting: Student

## 2023-08-09 DIAGNOSIS — Z1231 Encounter for screening mammogram for malignant neoplasm of breast: Secondary | ICD-10-CM

## 2023-09-24 ENCOUNTER — Ambulatory Visit
Admission: RE | Admit: 2023-09-24 | Discharge: 2023-09-24 | Disposition: A | Discharge status: Home / Self Care | Source: Ambulatory Visit | Attending: Student | Admitting: Student

## 2023-09-24 DIAGNOSIS — Z1231 Encounter for screening mammogram for malignant neoplasm of breast: Secondary | ICD-10-CM | POA: Insufficient documentation

## 2023-09-26 ENCOUNTER — Other Ambulatory Visit: Payer: Self-pay

## 2023-09-26 DIAGNOSIS — Z803 Family history of malignant neoplasm of breast: Secondary | ICD-10-CM

## 2023-09-26 DIAGNOSIS — Z9189 Other specified personal risk factors, not elsewhere classified: Secondary | ICD-10-CM

## 2023-09-27 ENCOUNTER — Other Ambulatory Visit: Payer: Self-pay | Admitting: Student

## 2023-09-27 DIAGNOSIS — R928 Other abnormal and inconclusive findings on diagnostic imaging of breast: Secondary | ICD-10-CM

## 2023-10-02 ENCOUNTER — Ambulatory Visit
Admission: RE | Admit: 2023-10-02 | Discharge: 2023-10-02 | Disposition: A | Discharge status: Home / Self Care | Source: Ambulatory Visit | Attending: Student

## 2023-10-02 ENCOUNTER — Ambulatory Visit
Admission: RE | Admit: 2023-10-02 | Discharge: 2023-10-02 | Disposition: A | Discharge status: Home / Self Care | Source: Ambulatory Visit | Attending: Student | Admitting: Student

## 2023-10-02 DIAGNOSIS — R928 Other abnormal and inconclusive findings on diagnostic imaging of breast: Secondary | ICD-10-CM | POA: Insufficient documentation

## 2024-03-10 ENCOUNTER — Other Ambulatory Visit

## 2024-03-31 ENCOUNTER — Other Ambulatory Visit

## 2024-04-12 ENCOUNTER — Other Ambulatory Visit

## 2024-04-18 ENCOUNTER — Other Ambulatory Visit
